# Patient Record
Sex: Female | Born: 1940 | Race: White | Hispanic: No | Marital: Married
Health system: Midwestern US, Academic
[De-identification: ages and names within clinical notes are randomized; demographics above are authoritative.]

---

## 2017-01-28 ENCOUNTER — Ambulatory Visit: Admit: 2017-01-28 | Discharge: 2017-01-28 | Payer: MEDICARE

## 2017-01-28 ENCOUNTER — Encounter: Admit: 2017-01-28 | Discharge: 2017-01-28 | Payer: MEDICARE

## 2017-01-28 DIAGNOSIS — G301 Alzheimer's disease with late onset: Principal | ICD-10-CM

## 2017-01-28 DIAGNOSIS — R4189 Other symptoms and signs involving cognitive functions and awareness: ICD-10-CM

## 2017-01-28 DIAGNOSIS — R413 Other amnesia: Principal | ICD-10-CM

## 2017-01-28 MED ORDER — MEMANTINE 10 MG PO TAB
10 mg | ORAL_TABLET | Freq: Two times a day (BID) | ORAL | 3 refills | Status: AC
Start: 2017-01-28 — End: 2018-02-10

## 2017-01-28 MED ORDER — RIVASTIGMINE 13.3 MG/24 HOUR TD PT24
1 | MEDICATED_PATCH | Freq: Every day | TRANSDERMAL | 3 refills | 90.00000 days | Status: AC
Start: 2017-01-28 — End: 2017-08-05

## 2017-01-28 MED ORDER — SERTRALINE 25 MG PO TAB
25 mg | ORAL_TABLET | Freq: Every day | ORAL | 3 refills | Status: AC
Start: 2017-01-28 — End: 2017-08-05

## 2017-01-28 NOTE — Progress Notes
Date of Service: 01/28/2017    Subjective:             Laurie Campbell is a 76 y.o. female. She is accompanied by her husband who relays the history.    History of Present Illness  Since last visit on 07/23/16, her memory has remained stable. She has a limited attention span.    She and her husband went down to [place] with their son this past winter for 10 days. She was disoriented while in [place]. She is wanting to go back to [place] again this winter, but her husband is not sure he can handle another trip to [place] due to her increased confusion.    Function  Activities of Daily Living:    Able to do the following without help?   I= Independent  A= Requires assistance  D= Dependent   Basic    [A] Dressing   [I] Eating/Feeding   [I] Ambulating/Transfers   [I] Toilet      [I] Maintains personal hygiene/Bathing Instrumental     [D] Shoping   [A] Household chores/Laundry   [D] Manage financial accounts   [D] Food preparation   [D] Arrange transportation/travel planning   [D] Medications   [D] Telephone     Safety  Is the patient still driving? No  Is the patient taking medications as prescribed? Yes  Are there concerns about safety in the home? No  Has the patient gotten lost in familiar places or wandered? No  Are firearms present in the home? No  Has the patient experienced unsteadiness or sustained falls? No  Does the patient live alone? No--lives with husband   Those involved in the patient's care include: Husband, extended family   Are these individuals willing and able to continue providing care/assistance? Yes   Other concerns identified? Sundowning behaviors. The information provided at last visit on Sundowning has been helpful.    Advance Care Planning  DPOA: Husband    Review of Systems   Constitutional: Positive for unexpected weight change (She has lost 8 pounds since last visit. She is currently eating 2 meals a day. Rarely does she eat a complete meal.). Negative for appetite change (Appetite is fair.).   HENT: Negative for trouble swallowing.    Respiratory: Negative for choking.    Gastrointestinal: Positive for diarrhea (Occasionally). Negative for constipation and nausea.        Occasional bowel incontinence due to diarrhea.   Genitourinary: Negative for difficulty urinating.        No urinary incontinence.   Musculoskeletal: Negative for gait problem (No recent falls or changes in gait.).   Neurological: Negative for speech difficulty.   Psychiatric/Behavioral: Positive for behavioral problems (She gets more irritable during the evening. She will want to go home or call her mom who is deceased. Her irritability has improved slightly since Laurie Campbell patch was increased to 13.3mg /day), confusion and hallucinations (Possible hallucinaitons where she sees people in their house.). Negative for agitation, dysphoric mood (She is typically in a good mood.) and sleep disturbance (No trouble going to sleep or staying asleep. She naps during the day.). The patient is not nervous/anxious.        Past Medical History:   Diagnosis Date   ??? Disorganized thinking    ??? Memory loss      Vascular disease? (CVA, CAD)  No  Previous head trauma? No  History of delirium with illness/hospitalizations? No  History of previous psychiatrics treatments? No  History reviewed. No  pertinent surgical history.     Family History   Problem Relation Age of Onset   ??? Unknown to Patient Maternal Grandmother    ??? Unknown to Patient Maternal Grandfather    ??? Unknown to Patient Paternal Grandmother    ??? Unknown to Patient Paternal Grandfather    ??? Unknown to Patient Mother    ??? Cancer Father      History of alcohol abuse? No   History of recreational drug use? No    Labs / Imaging  No results found for: TSH  No results found for: VITB12    Objective:         ??? levothyroxine (SYNTHROID) 75 mcg tablet Take 75 mcg by mouth daily.     ??? loperamide (IMODIUM) 2 mg capsule Take 2 mg by mouth as Needed. ??? losartan(+) (COZAAR) 100 mg tablet Take 100 mg by mouth daily.   ??? memantine (NAMENDA) 10 mg tablet Take 1 tablet by mouth twice daily.   ??? nadolol (CORGARD) 80 mg tablet Take 80 mg by mouth daily.     ??? potassium chloride SR (K-DUR) 10 mEq tablet Take 10 mEq by mouth daily.   ??? rivastigmine (EXELON) 13.3 mg/24 hour transdermal patch Apply 1 patch to top of skin as directed daily.   ??? sertraline (ZOLOFT) 25 mg tablet Take 1 tablet by mouth daily.   ??? triamterene-hydrochlorothiazide (DYAZIDE) 37.5-25 mg capsule Take 1 Cap by mouth every 48 hours.     I have personally reviewed the medication list and am making recommendations as listed in the plan below.   Husband manages medication at home for Laurie Campbell.       Vitals:    01/28/17 1124   BP: 143/68   Pulse: 52   Weight: 57.6 kg (127 lb)   Height: 168.9 cm (66.5)     Body mass index is 20.19 kg/m???.     Physical Exam  Mental status:  She scored 0/10 on MMSE for orientation.  She names and repeats well.    Cranial nerves:  Pupils are equal and reactive to light.  Extraocular movements are full. Tongue protrudes slightly to the left. All other cranial nerves intact.    Motor:  5/5 strength in the upper and lower extremities with no cogwheel rigidity.    Reflexes:  Reflexes are 1 in the arms and at the knees.      Coordination:  No dysmetria on finger-to-nose testing.    Gait:  Normal casual gait with normal arm swing, stride length and turns.    Neurobehavioral Evaluation: Mini-mental Status Exam Score was Mini-Mental Status Examination: 5/30. (On 07/23/16, the MMSE score was 8/30.)    PHQ Depression Scale: PHQ-2 Score: 1 (01/28/2017 11:25 AM). See below for any recommendations.    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 She has tried taking Aricept in the past, but could not tolerate due to GI side effects. She is able to tolerate Exelon patch 9.5 mg/day.          Alzheimer's disease  Since last visit, her memory has remained stable. Her PCP increased her Exelon Patch to 13.3mg /day and she has been tolerating the increased dose. Her husband feels that the increased dose of Exelon has improved some of her sundowning behaviors at night. She  continues to tolerate Namenda. Her mood is stable on Namenda. She continues to have some sundowning behaviors. I would like to refer patient and husband to the [facility] program that will pair them with a social worker who can help further address her Sundowning behaviors.    --Continue Exelon Patch (rivastigmine) 13.3mg /day, Namenda (memantine) 10mg  by mouth twice a day, and Zoloft (sertraline) 25mg  by mouth daily.  --Referral to the [facility] program for support. An [facility] staff member will get in touch with you.  --Reassure and redirect as needed.  --Keep active physically, mentally and socially.  --Return to clinic in 6 months.           Total Visit time: 45 minutes  Counseling time: 40 minutes  Regarding: Care planning, medications, test results and disease progression

## 2017-08-05 ENCOUNTER — Ambulatory Visit: Admit: 2017-08-05 | Discharge: 2017-08-05 | Payer: MEDICARE

## 2017-08-05 ENCOUNTER — Encounter: Admit: 2017-08-05 | Discharge: 2017-08-05 | Payer: MEDICARE

## 2017-08-05 DIAGNOSIS — G301 Alzheimer's disease with late onset: Principal | ICD-10-CM

## 2017-08-05 DIAGNOSIS — R413 Other amnesia: Principal | ICD-10-CM

## 2017-08-05 DIAGNOSIS — R4189 Other symptoms and signs involving cognitive functions and awareness: ICD-10-CM

## 2017-08-05 MED ORDER — SERTRALINE 50 MG PO TAB
50 mg | ORAL_TABLET | Freq: Every day | ORAL | 3 refills | Status: AC
Start: 2017-08-05 — End: 2018-02-10

## 2017-08-05 MED ORDER — RIVASTIGMINE 9.5 MG/24 HR TD PT24
1 | MEDICATED_PATCH | Freq: Every day | TRANSDERMAL | 3 refills | 90.00000 days | Status: AC
Start: 2017-08-05 — End: 2018-02-10

## 2018-02-10 ENCOUNTER — Ambulatory Visit: Admit: 2018-02-10 | Discharge: 2018-02-10 | Payer: MEDICARE

## 2018-02-10 ENCOUNTER — Encounter: Admit: 2018-02-10 | Discharge: 2018-02-10 | Payer: MEDICARE

## 2018-02-10 DIAGNOSIS — R413 Other amnesia: Principal | ICD-10-CM

## 2018-02-10 DIAGNOSIS — G301 Alzheimer's disease with late onset: Principal | ICD-10-CM

## 2018-02-10 DIAGNOSIS — R4189 Other symptoms and signs involving cognitive functions and awareness: ICD-10-CM

## 2018-02-10 MED ORDER — MEMANTINE 10 MG PO TAB
10 mg | ORAL_TABLET | Freq: Two times a day (BID) | ORAL | 3 refills | Status: AC
Start: 2018-02-10 — End: ?

## 2018-02-10 MED ORDER — SERTRALINE 50 MG PO TAB
75 mg | ORAL_TABLET | Freq: Every day | ORAL | 3 refills | Status: AC
Start: 2018-02-10 — End: 2018-08-11

## 2018-02-10 MED ORDER — RIVASTIGMINE 9.5 MG/24 HR TD PT24
1 | MEDICATED_PATCH | Freq: Every day | TRANSDERMAL | 3 refills | 90.00000 days | Status: AC
Start: 2018-02-10 — End: ?

## 2018-02-11 ENCOUNTER — Encounter: Admit: 2018-02-11 | Discharge: 2018-02-11 | Payer: MEDICARE

## 2018-02-11 DIAGNOSIS — R413 Other amnesia: Principal | ICD-10-CM

## 2018-02-11 DIAGNOSIS — R4189 Other symptoms and signs involving cognitive functions and awareness: ICD-10-CM

## 2018-08-11 ENCOUNTER — Encounter: Admit: 2018-08-11 | Discharge: 2018-08-11 | Payer: MEDICARE

## 2018-08-11 ENCOUNTER — Ambulatory Visit: Admit: 2018-08-11 | Discharge: 2018-08-11 | Payer: MEDICARE

## 2018-08-11 DIAGNOSIS — R4189 Other symptoms and signs involving cognitive functions and awareness: Secondary | ICD-10-CM

## 2018-08-11 DIAGNOSIS — R413 Other amnesia: Secondary | ICD-10-CM

## 2018-08-11 DIAGNOSIS — G301 Alzheimer's disease with late onset: Secondary | ICD-10-CM

## 2019-02-01 ENCOUNTER — Encounter: Admit: 2019-02-01 | Discharge: 2019-02-01

## 2019-02-01 NOTE — Telephone Encounter
RN received a VM from patient's husband reporting Wife's name, home number and DOB.    RN returned call and spoke to husband he reported that she is in a Landscape [facility], called [facility] on [address].  And that he would like to cancel her appointment. He had no further questions or concerns at this time.

## 2019-11-03 DEATH — deceased

## 2020-07-02 IMAGING — CR CHEST
2 series · 2 of 2 positions shown · non-contrast
Comparison: none

[shoulder external]
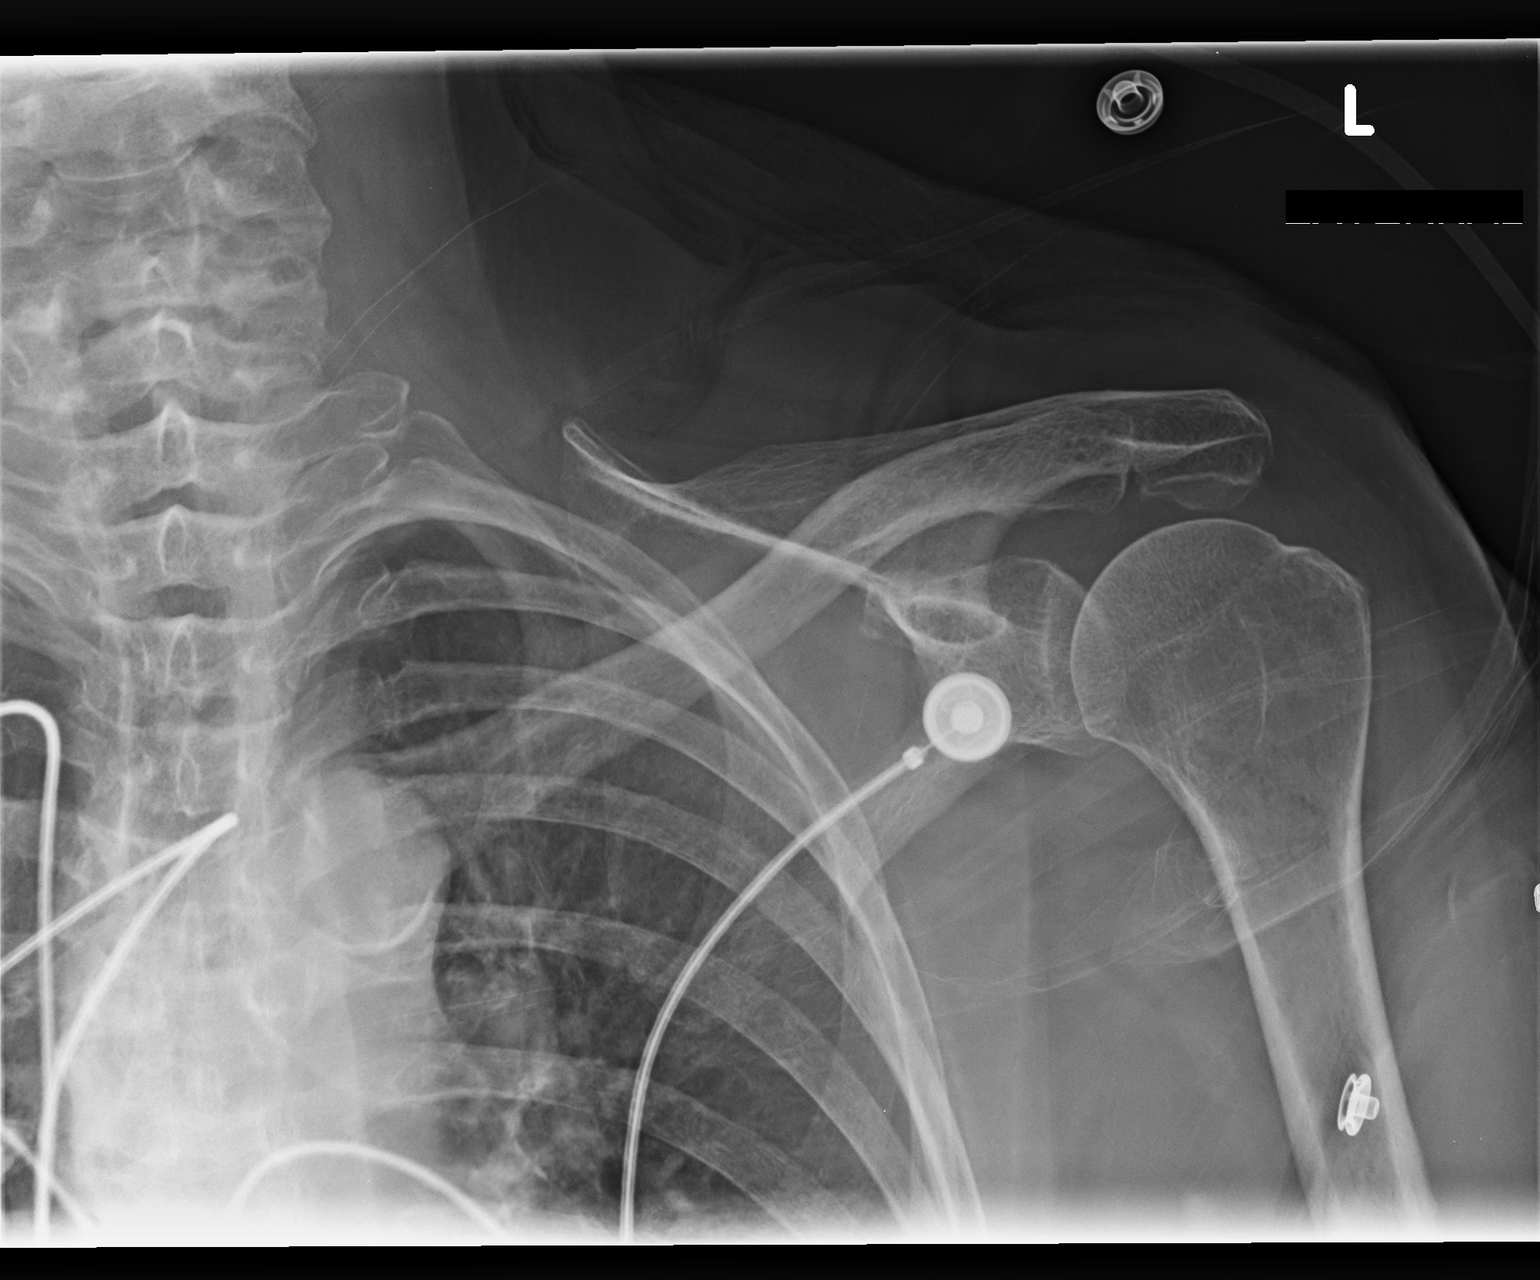

[shoulder y-view]
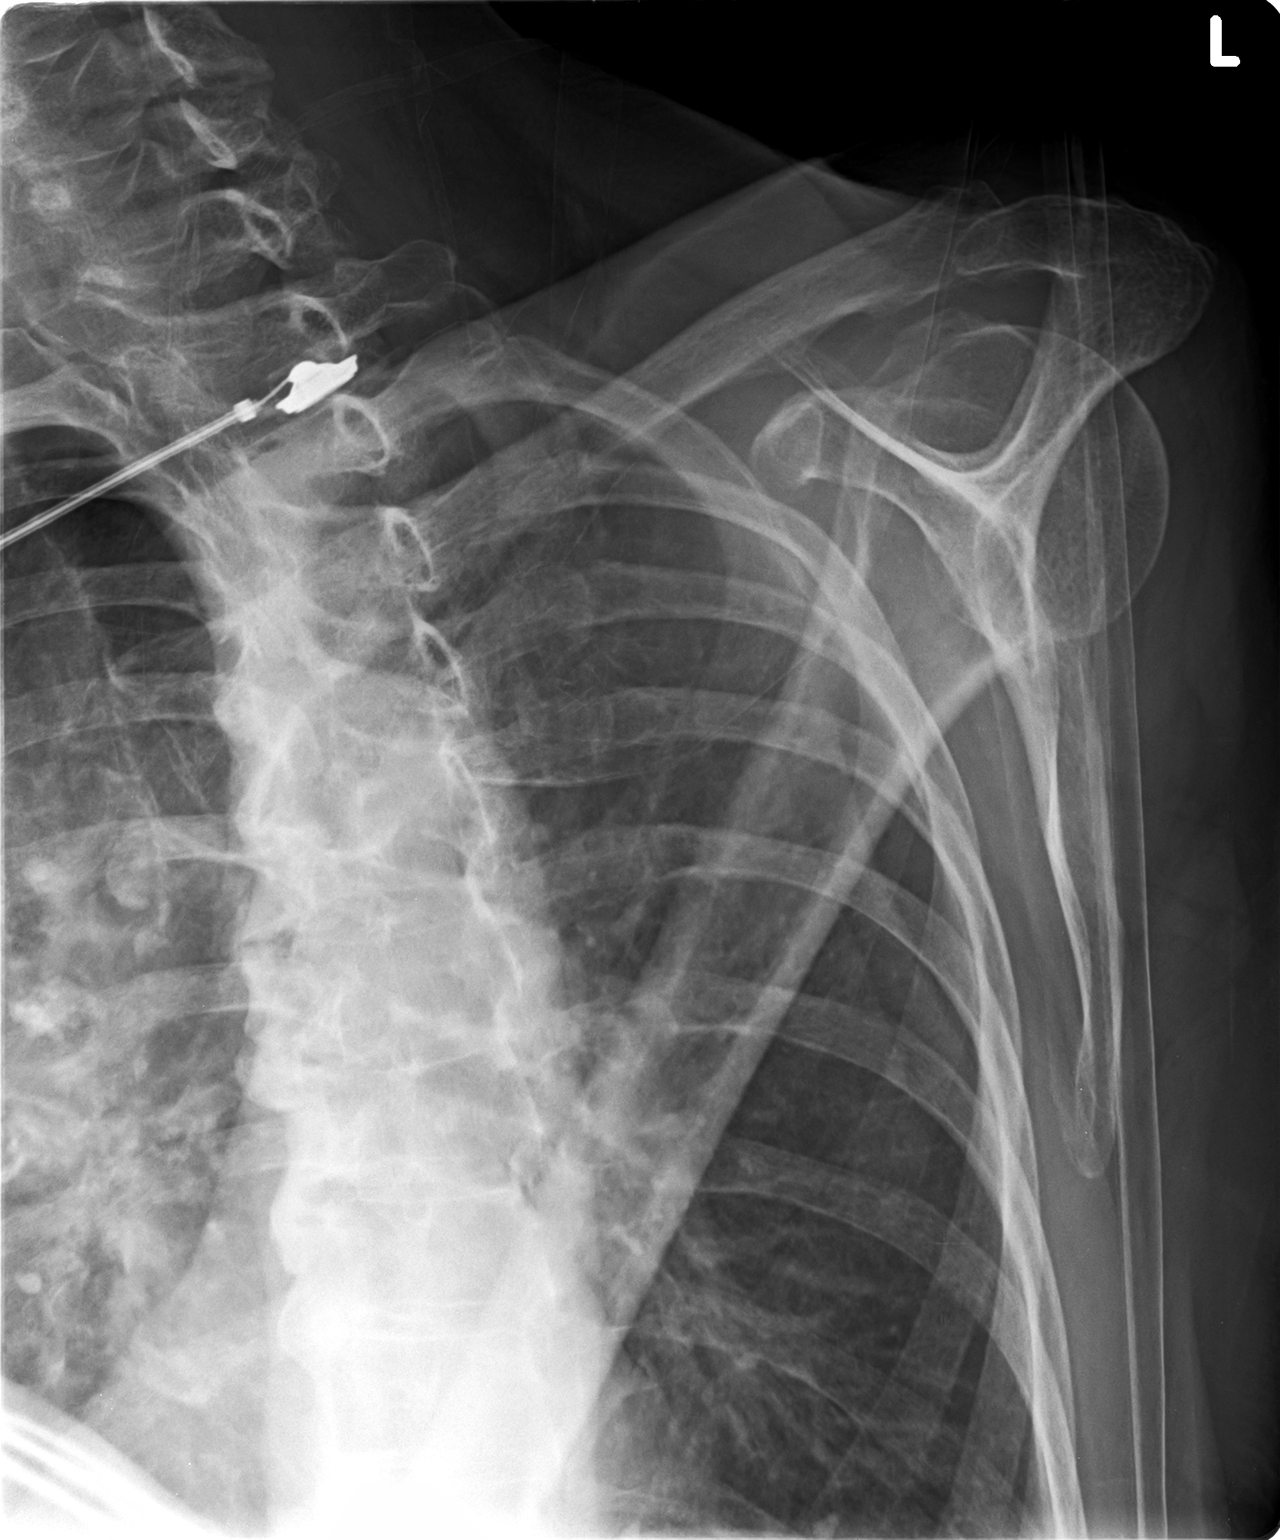

[2 of 2 positions shown; findings below may reference images not displayed]

DIAGNOSTIC STUDIES

EXAM
RADIOLOGICAL EXAMINATION SHOULDER; COMPLETE, MINIMUM 2 VIEWS CPT 13535

INDICATION
Pain.

TECHNIQUE
APinternal][externaland Y views of the left shoulder was obtained.

COMPARISONS
No priors available for comparison.

FINDINGS
[No acute fracture or dislocation.] [AC joint appears aligned.] [Adjacent ribs appear normal.]
[Soft tissues appear normal.]

IMPRESSION
No definite acute fracture or dislocation.

Tech Notes:

FALL, PT WITH LATE STAGE ALZHEIMERS, SLEEPING MORE, WT LOSS, FALL YESTERDAY MORNING WITH BRUISE TO
LEFT SHOULDER.  PT REPEATEDLY FALLS ASLEEP DURING EXAMS.  NO RECOLLECTION OF FALL.  CONFUSION
INCREASED.

## 2020-11-25 IMAGING — CR PELVIS
2 series · 2 of 2 positions shown · non-contrast
Comparison: none

[hip ap]
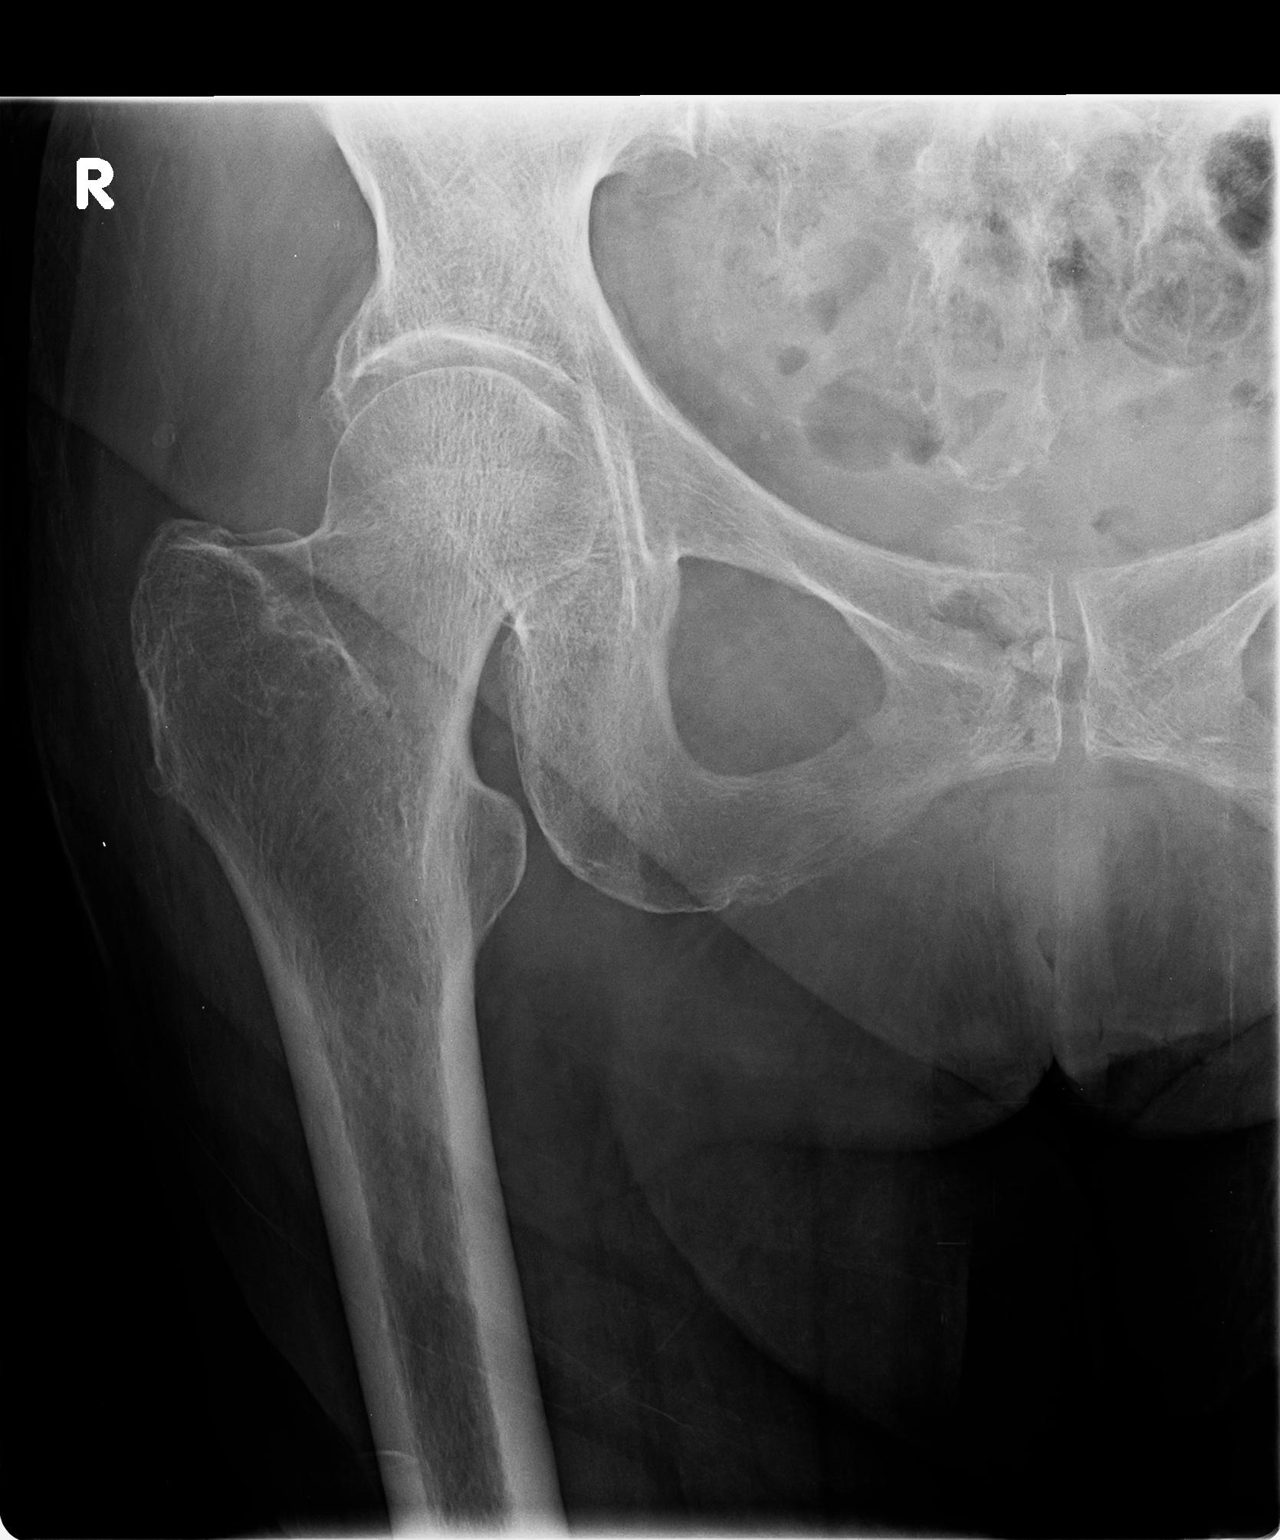

[hip frog]
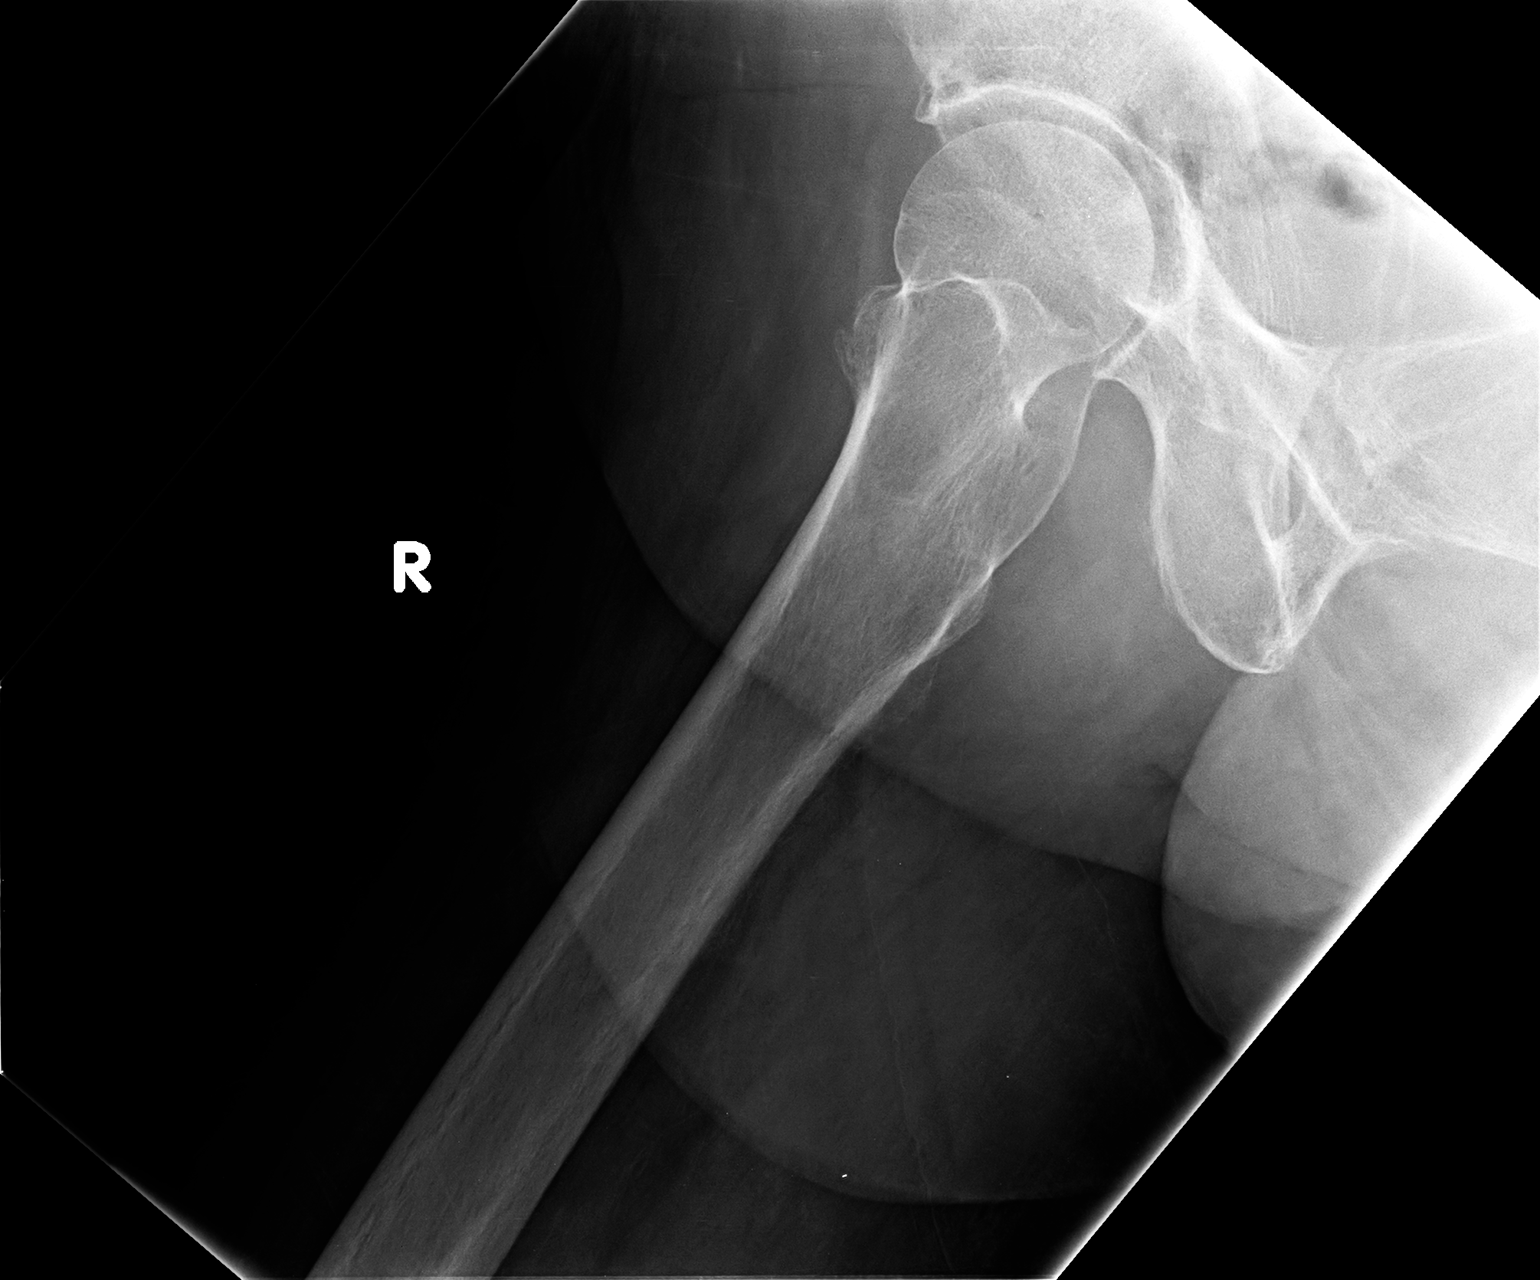

[2 of 2 positions shown; findings below may reference images not displayed]

DIAGNOSTIC STUDIES

EXAM

Right hip radiographs.

INDICATION

fall, right hip pain
PT C/O FALL LAST PM. PAIN IN RIGHT HIP. TJ/AK

TECHNIQUE

AP and frog lateral right hip views.

COMPARISONS

None.

FINDINGS

Bone demineralization. No displaced fracture. Mild marginal spurring of the hip joint without
significant joint space narrowing.

IMPRESSION

No displaced fracture is evident. If concern remains, CT or MRI could be considered.

Tech Notes:

PT C/O FALL LAST PM. PAIN IN RIGHT HIP. TJ/AK
